# Patient Record
Sex: Male | Born: 1965 | Race: White | Hispanic: No | Marital: Married | Smoking: Current every day smoker
Health system: Southern US, Community
[De-identification: ages and names within clinical notes are randomized; demographics above are authoritative.]

## PROBLEM LIST (undated history)

## (undated) DIAGNOSIS — I10 Essential (primary) hypertension: Secondary | ICD-10-CM

## (undated) HISTORY — PX: JOINT REPLACEMENT: SHX530

---

## 1999-06-24 ENCOUNTER — Encounter: Payer: Self-pay | Admitting: Emergency Medicine

## 1999-06-24 ENCOUNTER — Emergency Department (HOSPITAL_COMMUNITY): Admission: EM | Admit: 1999-06-24 | Discharge: 1999-06-24 | Payer: Self-pay | Admitting: Emergency Medicine

## 1999-12-16 ENCOUNTER — Encounter: Payer: Self-pay | Admitting: Emergency Medicine

## 1999-12-16 ENCOUNTER — Emergency Department (HOSPITAL_COMMUNITY): Admission: EM | Admit: 1999-12-16 | Discharge: 1999-12-16 | Payer: Self-pay | Admitting: Emergency Medicine

## 2002-05-11 ENCOUNTER — Emergency Department (HOSPITAL_COMMUNITY): Admission: EM | Admit: 2002-05-11 | Discharge: 2002-05-11 | Payer: Self-pay | Admitting: Emergency Medicine

## 2002-05-11 ENCOUNTER — Encounter: Payer: Self-pay | Admitting: Emergency Medicine

## 2008-02-21 ENCOUNTER — Encounter: Admission: RE | Admit: 2008-02-21 | Discharge: 2008-02-21 | Payer: Self-pay | Admitting: Orthopedic Surgery

## 2011-03-01 ENCOUNTER — Emergency Department (HOSPITAL_BASED_OUTPATIENT_CLINIC_OR_DEPARTMENT_OTHER)
Admission: EM | Admit: 2011-03-01 | Discharge: 2011-03-02 | Disposition: A | Discharge status: Nursing Home (Includes ICF) | Payer: BC Managed Care – PPO | Source: Home / Self Care | Attending: Emergency Medicine | Admitting: Emergency Medicine

## 2011-03-01 ENCOUNTER — Emergency Department (INDEPENDENT_AMBULATORY_CARE_PROVIDER_SITE_OTHER): Payer: BC Managed Care – PPO

## 2011-03-01 DIAGNOSIS — M79609 Pain in unspecified limb: Secondary | ICD-10-CM

## 2011-03-01 DIAGNOSIS — F17200 Nicotine dependence, unspecified, uncomplicated: Secondary | ICD-10-CM | POA: Insufficient documentation

## 2011-03-01 DIAGNOSIS — R072 Precordial pain: Secondary | ICD-10-CM

## 2011-03-01 DIAGNOSIS — R079 Chest pain, unspecified: Secondary | ICD-10-CM | POA: Insufficient documentation

## 2011-03-01 LAB — CBC
Platelets: 174 10*3/uL (ref 150–400)
RBC: 4.92 MIL/uL (ref 4.22–5.81)
RDW: 12.7 % (ref 11.5–15.5)
WBC: 9.1 10*3/uL (ref 4.0–10.5)

## 2011-03-02 ENCOUNTER — Observation Stay (HOSPITAL_COMMUNITY)
Admission: EM | Admit: 2011-03-02 | Discharge: 2011-03-02 | Disposition: A | Discharge status: Home / Self Care | Payer: BC Managed Care – PPO | Source: Ambulatory Visit | Attending: Emergency Medicine | Admitting: Emergency Medicine

## 2011-03-02 DIAGNOSIS — E119 Type 2 diabetes mellitus without complications: Secondary | ICD-10-CM | POA: Insufficient documentation

## 2011-03-02 DIAGNOSIS — R209 Unspecified disturbances of skin sensation: Secondary | ICD-10-CM | POA: Insufficient documentation

## 2011-03-02 DIAGNOSIS — R072 Precordial pain: Secondary | ICD-10-CM

## 2011-03-02 DIAGNOSIS — R0602 Shortness of breath: Secondary | ICD-10-CM | POA: Insufficient documentation

## 2011-03-02 DIAGNOSIS — R51 Headache: Secondary | ICD-10-CM | POA: Insufficient documentation

## 2011-03-02 DIAGNOSIS — F17200 Nicotine dependence, unspecified, uncomplicated: Secondary | ICD-10-CM | POA: Insufficient documentation

## 2011-03-02 DIAGNOSIS — E669 Obesity, unspecified: Secondary | ICD-10-CM | POA: Insufficient documentation

## 2011-03-02 DIAGNOSIS — R079 Chest pain, unspecified: Principal | ICD-10-CM | POA: Insufficient documentation

## 2011-03-02 LAB — POCT CARDIAC MARKERS
CKMB, poc: <1 ng/mL — ABNORMAL LOW (ref 1.0–8.0)
CKMB, poc: <1 ng/mL — ABNORMAL LOW (ref 1.0–8.0)
Myoglobin, poc: 25.8 ng/mL (ref 12–200)
Myoglobin, poc: 32.3 ng/mL (ref 12–200)
Troponin i, poc: <0.05 ng/mL (ref 0.00–0.09)
Troponin i, poc: <0.05 ng/mL (ref 0.00–0.09)
Troponin i, poc: <0.05 ng/mL (ref 0.00–0.09)

## 2011-03-02 LAB — HEMOGLOBIN A1C
Hgb A1c MFr Bld: 7.1 % — ABNORMAL HIGH (ref <5.7)
Mean Plasma Glucose: 157 mg/dL — ABNORMAL HIGH (ref <117)

## 2011-03-02 LAB — COMPREHENSIVE METABOLIC PANEL
BUN: 12 mg/dL (ref 6–23)
Calcium: 9 mg/dL (ref 8.4–10.5)
Creatinine, Ser: 0.7 mg/dL (ref 0.4–1.5)
Glucose, Bld: 258 mg/dL — ABNORMAL HIGH (ref 70–99)
Total Protein: 6.5 g/dL (ref 6.0–8.3)

## 2011-03-02 LAB — D-DIMER, QUANTITATIVE: D-Dimer, Quant: <0.22 ug/mL-FEU (ref 0.00–0.48)

## 2011-09-04 ENCOUNTER — Emergency Department (INDEPENDENT_AMBULATORY_CARE_PROVIDER_SITE_OTHER): Payer: BC Managed Care – PPO

## 2011-09-04 ENCOUNTER — Emergency Department (HOSPITAL_BASED_OUTPATIENT_CLINIC_OR_DEPARTMENT_OTHER)
Admission: EM | Admit: 2011-09-04 | Discharge: 2011-09-04 | Disposition: A | Discharge status: Home / Self Care | Payer: BC Managed Care – PPO | Attending: Emergency Medicine | Admitting: Emergency Medicine

## 2011-09-04 ENCOUNTER — Encounter: Payer: Self-pay | Admitting: *Deleted

## 2011-09-04 DIAGNOSIS — M25569 Pain in unspecified knee: Secondary | ICD-10-CM | POA: Insufficient documentation

## 2011-09-04 DIAGNOSIS — X500XXA Overexertion from strenuous movement or load, initial encounter: Secondary | ICD-10-CM | POA: Insufficient documentation

## 2011-09-04 DIAGNOSIS — M25562 Pain in left knee: Secondary | ICD-10-CM

## 2011-09-04 DIAGNOSIS — Y9364 Activity, baseball: Secondary | ICD-10-CM | POA: Insufficient documentation

## 2011-09-04 DIAGNOSIS — F17200 Nicotine dependence, unspecified, uncomplicated: Secondary | ICD-10-CM | POA: Insufficient documentation

## 2011-09-04 MED ORDER — IBUPROFEN 400 MG PO TABS
600.0000 mg | ORAL_TABLET | Freq: Once | ORAL | Status: AC
  Administered 2011-09-04: 600 mg via ORAL
  Filled 2011-09-04: qty 1

## 2011-09-04 MED ORDER — IBUPROFEN 600 MG PO TABS: 600.0000 mg | ORAL_TABLET | Freq: Four times a day (QID) | ORAL | Status: AC | PRN

## 2011-09-04 MED ORDER — OXYCODONE-ACETAMINOPHEN 5-325 MG PO TABS: 1.0000 | ORAL_TABLET | ORAL | Status: AC | PRN

## 2011-09-04 NOTE — ED Provider Notes (Signed)
History    Scribed for Lyanne Co, MD, the patient was seen in room MH01/MH01. This chart was scribed by Katha Cabal. This patient's care was started at 15:20.  CSN: [id number] Arrival date & time: 09/04/2011  2:55 PM  Chief Complaint  Patient presents with  . Knee Injury     HPI  Ronald Maddox is a 45 y.o. male who presents to the Emergency Department complaining of continuous left knee pain for 3 weeks.  Injured knee while playing softball 3 weeks ago.  Patient was rounding base and planted and twisted left knee.  Reports tingling on medial aspect of left knee. Walking makes the pain worse.   Denies numbness, weakness, fever, chills, denies any other trauma.  Seen previously at [facility] for previous right knee injury.    PAST MEDICAL HISTORY:  History reviewed. No pertinent past medical history.  PAST SURGICAL HISTORY:  Past Surgical History  Procedure Date  . Joint replacement     FAMILY HISTORY:  History reviewed. No pertinent family history.   SOCIAL HISTORY: History   Social History  . Marital Status: Married    Spouse Name: N/A    Number of Children: N/A  . Years of Education: N/A   Social History Main Topics  . Smoking status: Current Everyday Smoker -- 1.0 packs/day  . Smokeless tobacco: None  . Alcohol Use: No  . Drug Use: No  . Sexually Active: No   Other Topics Concern  . None   Social History Narrative  . None      Review of Systems  All other systems reviewed and are negative.     Allergies  Review of patient's allergies indicates no known allergies.  Home Medications  No current outpatient prescriptions on file.  BP 158/95  Pulse 73  Temp(Src) 98 F (36.7 C) (Oral)  Resp 16  Ht 5\' 10"  (1.778 m)  Wt 320 lb (145.151 kg)  BMI 45.92 kg/m2  SpO2 100%  Physical Exam  Nursing note and vitals reviewed. Constitutional: He is oriented to person, place, and time. He appears well-developed and well-nourished.    HENT:  Head: Normocephalic.  Eyes: EOM are normal.  Neck: Normal range of motion.  Pulmonary/Chest: Effort normal.  Musculoskeletal: Normal range of motion.       No tenderness lateral aspect of left knee, mild tenderness of medial ascpect of joint line, No joint effusion, Distal pulse intact   Neurological: He is alert and oriented to person, place, and time.  Skin: Skin is warm and dry.       No erythema of left knee   Psychiatric: He has a normal mood and affect.    ED Course  Procedures (including critical care time)  Labs Reviewed - No data to display OTHER DATA REVIEWED: Nursing notes, vital signs, and past medical records reviewed.   DIAGNOSTIC STUDIES: Oxygen Saturation is 100% on room air, normal by my interpretation.       LABS / RADIOLOGY:   Dg Knee Complete 4 Views Left  09/04/2011  *RADIOLOGY REPORT*  Clinical Data: Persistent anterior knee pain.  Injury 3 weeks ago.  LEFT KNEE - COMPLETE 4+ VIEW  Comparison: None.  Findings: There is medial joint space loss with osteophytes. The mineralization and alignment are normal.  There is no evidence of acute fracture or dislocation.  No knee joint effusion is demonstrated.  IMPRESSION: No acute osseous findings.  Medial compartment joint space loss.  Original Report Authenticated By: Chrissie Noa  B. Purcell Mouton, M.D.      ED COURSE / COORDINATION OF CARE: 3:31 PM  Physical exam complete.  Will XR left knee.   Orders Placed This Encounter  Procedures  . DG Knee Complete 4 Views Left         MDM   MDM: Joint space on x-ray would suggest severe osteoarthritis with likely ongoing pain from this he overuse injury of left knee.  Full range of motion of left knee.  Patient will be referred to orthopedics since   IMPRESSION: Diagnoses that have been ruled out:  Diagnoses that are still under consideration:  Final diagnoses:  Pain in left knee     MEDICATIONS GIVEN IN THE E.D. Scheduled Meds:    . ibuprofen  600  mg Oral Once   Continuous Infusions:     DISCHARGE MEDICATIONS: New Prescriptions   IBUPROFEN (ADVIL,MOTRIN) 600 MG TABLET    Take 1 tablet (600 mg total) by mouth every 6 (six) hours as needed for pain.   OXYCODONE-ACETAMINOPHEN (PERCOCET) 5-325 MG PER TABLET    Take 1 tablet by mouth every 4 (four) hours as needed for pain.     I personally performed the services described in this documentation, which was scribed in my presence. The recorded information has been reviewed and considered.            Lyanne Co, MD 09/04/11 [phone]

## 2011-09-04 NOTE — ED Notes (Signed)
Pt c/o  Left knee injury x 3 weeks.

## 2016-07-14 ENCOUNTER — Encounter (HOSPITAL_COMMUNITY): Payer: Self-pay | Admitting: Emergency Medicine

## 2016-07-14 ENCOUNTER — Emergency Department (HOSPITAL_COMMUNITY)
Admission: EM | Admit: 2016-07-14 | Discharge: 2016-07-14 | Disposition: A | Discharge status: Home / Self Care | Payer: Self-pay | Attending: Emergency Medicine | Admitting: Emergency Medicine

## 2016-07-14 DIAGNOSIS — F17200 Nicotine dependence, unspecified, uncomplicated: Secondary | ICD-10-CM | POA: Insufficient documentation

## 2016-07-14 DIAGNOSIS — R197 Diarrhea, unspecified: Secondary | ICD-10-CM | POA: Insufficient documentation

## 2016-07-14 NOTE — ED Provider Notes (Signed)
[facility]-EMERGENCY DEPT Provider Note   CSN: [id number] Arrival date & time: 07/14/16  0753     History   Chief Complaint Chief Complaint  Patient presents with  . Diarrhea    HPI Ronald Maddox is a 50 y.o. male.   Diarrhea   This is a new problem. The current episode started more than 2 days ago. The problem occurs 2 to 4 times per day. The problem has been gradually improving. The stool consistency is described as watery (non bloody). Maximum temperature: had sweats x1, did not record temp. The fever has been present for less than 1 day. Associated symptoms include abdominal pain and sweats. Pertinent negatives include no vomiting, no chills, no headaches, no URI and no cough. He has tried anti-motility drugs and increased fluid intake for the symptoms. The treatment provided mild relief. Risk factors include suspect food intake and travel to endemic areas. His past medical history does not include irritable bowel syndrome, inflammatory bowel disease, short gut syndrome, bowel resection, recent abdominal surgery or malabsorption.  Abdominal Pain  Associated symptoms include diarrhea. Pertinent negatives include vomiting, dysuria, hematuria and headaches. Fever: unknown. His past medical history does not include irritable bowel syndrome.    No past medical history on file.  There are no active problems to display for this patient.   Past Surgical History:  Procedure Laterality Date  . JOINT REPLACEMENT         Home Medications    Prior to Admission medications   Medication Sig Start Date End Date Taking? Authorizing Provider  OVER THE COUNTER MEDICATION Take 1 tablet by mouth 3 (three) times daily. Raspberry Ketone     Historical Provider, MD    Family History No family history on file.  Social History Social History  Substance Use Topics  . Smoking status: Current Every Day Smoker    Packs/day: 1.00  . Smokeless tobacco: Not on file  . Alcohol use No      Allergies   Review of patient's allergies indicates no known allergies.   Review of Systems Review of Systems  Constitutional: Positive for diaphoresis. Negative for chills. Fever: unknown.  HENT: Negative.   Eyes: Negative.   Respiratory: Negative for cough.   Gastrointestinal: Positive for abdominal pain and diarrhea. Negative for vomiting.  Genitourinary: Negative for dysuria and hematuria.  Musculoskeletal: Negative.   Skin: Negative.   Neurological: Negative for syncope, light-headedness and headaches.  Psychiatric/Behavioral: Negative.      Physical Exam Updated Vital Signs BP 172/100 (BP Location: Right Arm)   Pulse 75   Temp 98.9 F (37.2 C) (Oral)   Resp 18   Wt 131.5 kg   SpO2 100%   BMI 41.61 kg/m   Physical Exam  Constitutional: He is oriented to person, place, and time. He appears well-developed and well-nourished. No distress.  Looks well, pleasant, hydrated, cooperative  HENT:  Head: Normocephalic and atraumatic.  Mouth/Throat: Oropharynx is clear and moist.  Eyes: Conjunctivae are normal. Right eye exhibits no discharge. Left eye exhibits no discharge. No scleral icterus.  Neck: Normal range of motion. Neck supple. No tracheal deviation present.  Cardiovascular: Normal rate, regular rhythm, normal heart sounds and intact distal pulses.   No murmur heard. Pulmonary/Chest: Effort normal and breath sounds normal. No stridor. No respiratory distress. He has no wheezes. He has no rales.  Abdominal: Soft. Bowel sounds are normal. He exhibits distension (mild). There is no tenderness. There is no rebound and no guarding.  No hyperactive  bowel sounds  Musculoskeletal: He exhibits no edema, tenderness or deformity.  Neurological: He is alert and oriented to person, place, and time. He is not disoriented. GCS eye subscore is 4. GCS verbal subscore is 5. GCS motor subscore is 6.  Skin: Skin is warm and dry. Capillary refill takes less than 2 seconds. He is  not diaphoretic.  Good skin turgor  Psychiatric: He has a normal mood and affect. His behavior is normal. Judgment and thought content normal.  Nursing note and vitals reviewed.    ED Treatments / Results  Labs (all labs ordered are listed, but only abnormal results are displayed) Labs Reviewed - No data to display  EKG  EKG Interpretation None       Radiology No results found.  Procedures Procedures (including critical care time)  Medications Ordered in ED Medications - No data to display   Initial Impression / Assessment and Plan / ED Course  I have reviewed the triage vital signs and the nursing notes.  Pertinent labs & imaging results that were available during my care of the patient were reviewed by me and considered in my medical decision making (see chart for details).  Clinical Course   50 year old male with no significant past me27dical history presents to the emergency department because of 5 days of watery diarrhea. Patient states he initially had dark, black stools however those temporary and resolved quickly. Stools were initially very loose, but patient reports they are becoming more formed currently. Decreased frequency of BMs as well per patient. Patient does report suspicious water intake in [place]. Abdominal pain is only present during bowel movements. Denies rectal pain. Denies any melena or hematochezia for the past few days. No nausea no vomiting.. Tolerating by mouth fluids well. Attempting solid foods as well. Abdomen is mildly distended subjectively however patient denies any pain on physical exam--unclear as to why patient rates pain as 8 on vitals assessment since he stated he had no pain currently, only with Bms. Patient appears well on exam with no evidence of a surgical abdomen at this time. No dysuria/hematuria. No significant infectious symptoms asides from one episode of sweats while in [place]. Low suspicion for ETEC/EHEC/Salmonella based on  description of BMs. Patient does not have any other medical problems, therefore doubt IBS, IBD, malabsorption syndrome secondary to pancreatic dysfunction. Patient likely is suffering from traveler's diarrhea. Discussed clinical nature of diagnosis. Patient currently appears well hydrated so will defer bloodwork. Patient states understanding and is in agreement with discharge. Encouraged establishing care with PCP, if anything for general health screenings (preventative health, Dm/HTN screening) . Usual and customary return precautions discussed for diarrhea. Patient and VS stable at d/c.  Final Clinical Impressions(s) / ED Diagnoses   Final diagnoses:  Diarrhea of presumed infectious origin    New Prescriptions Discharge Medication List as of 07/14/2016  9:03 AM        Maretta BeesLouis Gurleen Larrivee, MD 07/16/16 [id number]    Loren Raceravid Yelverton, MD 07/16/16 1627

## 2016-07-14 NOTE — ED Triage Notes (Signed)
[place] had diarrhea for 3 days ,  OTC meds not working

## 2017-06-06 ENCOUNTER — Emergency Department (HOSPITAL_COMMUNITY)
Admission: EM | Admit: 2017-06-06 | Discharge: 2017-06-06 | Disposition: A | Discharge status: Home / Self Care | Payer: Worker's Compensation | Attending: Emergency Medicine | Admitting: Emergency Medicine

## 2017-06-06 ENCOUNTER — Encounter (HOSPITAL_COMMUNITY): Payer: Self-pay | Admitting: Emergency Medicine

## 2017-06-06 ENCOUNTER — Emergency Department (HOSPITAL_COMMUNITY): Payer: Worker's Compensation

## 2017-06-06 DIAGNOSIS — L02214 Cutaneous abscess of groin: Secondary | ICD-10-CM | POA: Diagnosis not present

## 2017-06-06 DIAGNOSIS — K4090 Unilateral inguinal hernia, without obstruction or gangrene, not specified as recurrent: Secondary | ICD-10-CM

## 2017-06-06 DIAGNOSIS — F17200 Nicotine dependence, unspecified, uncomplicated: Secondary | ICD-10-CM | POA: Insufficient documentation

## 2017-06-06 DIAGNOSIS — R2242 Localized swelling, mass and lump, left lower limb: Secondary | ICD-10-CM | POA: Diagnosis present

## 2017-06-06 DIAGNOSIS — L0291 Cutaneous abscess, unspecified: Secondary | ICD-10-CM

## 2017-06-06 LAB — CBC WITH DIFFERENTIAL/PLATELET
Basophils Absolute: 0 10*3/uL (ref 0.0–0.1)
Basophils Relative: 0 %
Eosinophils Absolute: 0.2 10*3/uL (ref 0.0–0.7)
Eosinophils Relative: 1 %
HCT: 43.3 % (ref 39.0–52.0)
Hemoglobin: 14.9 g/dL (ref 13.0–17.0)
Lymphocytes Relative: 15 %
Lymphs Abs: 2.1 10*3/uL (ref 0.7–4.0)
MCH: 31 pg (ref 26.0–34.0)
MCHC: 34.4 g/dL (ref 30.0–36.0)
MCV: 90.2 fL (ref 78.0–100.0)
Monocytes Absolute: 1.2 10*3/uL — ABNORMAL HIGH (ref 0.1–1.0)
Monocytes Relative: 9 %
Neutro Abs: 10.5 10*3/uL — ABNORMAL HIGH (ref 1.7–7.7)
Neutrophils Relative %: 75 %
Platelets: 279 10*3/uL (ref 150–400)
RBC: 4.8 MIL/uL (ref 4.22–5.81)
RDW: 12.6 % (ref 11.5–15.5)
WBC: 14 10*3/uL — ABNORMAL HIGH (ref 4.0–10.5)

## 2017-06-06 LAB — BASIC METABOLIC PANEL
Anion gap: 7 (ref 5–15)
BUN: <5 mg/dL — ABNORMAL LOW (ref 6–20)
CO2: 26 mmol/L (ref 22–32)
Calcium: 8.9 mg/dL (ref 8.9–10.3)
Chloride: 97 mmol/L — ABNORMAL LOW (ref 101–111)
Creatinine, Ser: 0.62 mg/dL (ref 0.61–1.24)
GFR calc Af Amer: >60 mL/min (ref >60)
GFR calc non Af Amer: >60 mL/min (ref >60)
Glucose, Bld: 285 mg/dL — ABNORMAL HIGH (ref 65–99)
Potassium: 4.5 mmol/L (ref 3.5–5.1)
Sodium: 130 mmol/L — ABNORMAL LOW (ref 135–145)

## 2017-06-06 MED ORDER — HYDROMORPHONE HCL 1 MG/ML IJ SOLN
1.0000 mg | Freq: Once | INTRAMUSCULAR | Status: AC
  Administered 2017-06-06: 1 mg via INTRAVENOUS
  Filled 2017-06-06: qty 1

## 2017-06-06 MED ORDER — LIDOCAINE HCL 2 % IJ SOLN
20.0000 mL | Freq: Once | INTRAMUSCULAR | Status: DC
  Filled 2017-06-06: qty 20

## 2017-06-06 MED ORDER — IOPAMIDOL (ISOVUE-300) INJECTION 61%
INTRAVENOUS | Status: AC
  Administered 2017-06-06: 100 mL
  Filled 2017-06-06: qty 100

## 2017-06-06 MED ORDER — SODIUM CHLORIDE 0.9 % IV BOLUS (SEPSIS)
1000.0000 mL | Freq: Once | INTRAVENOUS | Status: AC
  Administered 2017-06-06: 1000 mL via INTRAVENOUS

## 2017-06-06 MED ORDER — OXYCODONE-ACETAMINOPHEN 5-325 MG PO TABS: 1.0000 | ORAL_TABLET | Freq: Four times a day (QID) | ORAL | 0 refills | Status: DC | PRN

## 2017-06-06 MED ORDER — MORPHINE SULFATE (PF) 4 MG/ML IV SOLN
4.0000 mg | Freq: Once | INTRAVENOUS | Status: AC
  Administered 2017-06-06: 4 mg via INTRAVENOUS
  Filled 2017-06-06: qty 1

## 2017-06-06 MED ORDER — SULFAMETHOXAZOLE-TRIMETHOPRIM 800-160 MG PO TABS: 2.0000 | ORAL_TABLET | Freq: Two times a day (BID) | ORAL | 0 refills | Status: AC

## 2017-06-06 MED ORDER — SULFAMETHOXAZOLE-TRIMETHOPRIM 800-160 MG PO TABS
1.0000 | ORAL_TABLET | Freq: Once | ORAL | Status: AC
  Administered 2017-06-06: 1 via ORAL
  Filled 2017-06-06: qty 1

## 2017-06-06 NOTE — ED Triage Notes (Signed)
Pt. Stated, I was at work and I think I rupture something in my left groin area. It hurts to touch or move .

## 2017-06-06 NOTE — ED Notes (Signed)
Patient transported to CT 

## 2017-06-06 NOTE — ED Provider Notes (Signed)
[facility]PT Provider Note   CSN: [id number] Arrival date & time: 06/06/17  1248  By signing my name below, I, Cynda Acres, attest that this documentation has been prepared under the direction and in the presence of [employer], [name], . Electronically Signed: Cynda Acres, Scribe. 06/06/17. 3:24 PM.  History   Chief Complaint Chief Complaint  Patient presents with  . Groin Pain    HPI Comments: Ronald Maddox is a 51 y.o. male with no pertinent past medical history, who presents to the Emergency Department complaining of gradual-onset, constant left groin pain that began yesterday. 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 Patient states later that night he began having a "burning" pain to the left groin. Patient believes he may have a hernia. Patient reports associated swelling to the left groin. No medications taken prior to arrival. No modifying factors indicated. Patient states his pain is worse with ambulation/sitting/coughing, nothing improves his pain. Patient states he takes aspirin daily. Patient is a chronic tobacco smoker of one pack a day. Patient denies any fever, chills, numbness, weakness, penile pain, hematuria, dysuria, or any additional symptoms.   The history is provided by the patient. No language interpreter was used.    History reviewed. No pertinent past medical history.  There are no active problems to display for this patient.   Past Surgical History:  Procedure Laterality Date  . JOINT REPLACEMENT        Home Medications    Prior to Admission medications   Medication Sig Start Date End Date Taking? Authorizing Provider  OVER THE COUNTER MEDICATION Take 1 tablet by mouth 3 (three) times daily. Raspberry Ketone     [provider]  oxyCODONE-acetaminophen (PERCOCET/ROXICET) 5-325 MG tablet Take 1 tablet by mouth every 6 (six) hours as needed for severe pain. 06/06/17   Lalia Loudon,  Tinnie Gens, [name]  sulfamethoxazole-trimethoprim (BACTRIM DS,SEPTRA DS) 800-160 MG tablet Take 2 tablets by mouth 2 (two) times daily. 06/06/17 06/13/17  [name], [name]    Family History No family history on file.  Social History Social History  Substance Use Topics  . Smoking status: Current Every Day Smoker    Packs/day: 1.00  . Smokeless tobacco: Never Used  . Alcohol use No     Allergies   Patient has no known allergies.   Review of Systems Review of Systems  Constitutional: Negative for chills and fever.  Genitourinary: Positive for testicular pain. Negative for dysuria, flank pain, hematuria and penile pain.  Neurological: Negative for weakness and numbness.  All other systems reviewed and are negative.  Physical Exam Updated Vital Signs BP (!) 167/81 (BP Location: Right Arm)   Pulse 78   Temp 99.8 F (37.7 C) (Oral)   Resp 18   Ht 5\' 10"  (1.778 m)   Wt (!) 145.2 kg (320 lb)   SpO2 96%   BMI 45.92 kg/m   Physical Exam  Constitutional: He is oriented to person, place, and time. He appears well-developed.  HENT:  Head: Normocephalic and atraumatic.  Mouth/Throat: Oropharynx is clear and moist.  Eyes: Conjunctivae and EOM are normal. Pupils are equal, round, and reactive to light.  Neck: Normal range of motion. Neck supple.  Cardiovascular: Normal rate and regular rhythm.   Pulmonary/Chest: Effort normal. No respiratory distress. He has wheezes. He has no rales.  Expiratory wheezes bilaterally.   Abdominal: Soft. Bowel sounds are normal.  Genitourinary:  Genitourinary Comments: Chaperone present throughout  entire exam. Left inguinal bulge.   Musculoskeletal: Normal range of motion.  Left inguinal bulge.   Neurological: He is alert and oriented to person, place, and time.  Skin: Skin is warm and dry.  Nursing note and vitals reviewed.    ED Treatments / Results  DIAGNOSTIC STUDIES: Oxygen Saturation is 98% on RA, normal by my interpretation.     COORDINATION OF CARE:   Labs (all labs ordered are listed, but only abnormal results are displayed) Labs Reviewed  CBC WITH DIFFERENTIAL/PLATELET - Abnormal; Notable for the following:       Result Value   WBC 14.0 (*)    Neutro Abs 10.5 (*)    Monocytes Absolute 1.2 (*)    All other components within normal limits  BASIC METABOLIC PANEL - Abnormal; Notable for the following:    Sodium 130 (*)    Chloride 97 (*)    Glucose, Bld 285 (*)    BUN <5 (*)    All other components within normal limits    EKG  EKG Interpretation None       Radiology Ct Abdomen Pelvis W Contrast  Result Date: 06/06/2017 CLINICAL DATA:  Left groin pain. EXAM: CT ABDOMEN AND PELVIS WITH CONTRAST TECHNIQUE: Multidetector CT imaging of the abdomen and pelvis was performed using the standard protocol following bolus administration of intravenous contrast. CONTRAST:  100mL ISOVUE-300 IOPAMIDOL (ISOVUE-300) INJECTION 61% COMPARISON:  None. FINDINGS: Lower chest: Lung bases are clear. No effusions. Heart is normal size. Hepatobiliary: No focal hepatic abnormality. Gallbladder unremarkable. Pancreas: No focal abnormality or ductal dilatation. Spleen: No focal abnormality.  Normal size. Adrenals/Urinary Tract: Mild bilateral perinephric stranding. No renal or ureteral stones. No hydronephrosis. Small exophytic cyst off the posterior mid pole of the right kidney. Adrenal glands and urinary bladder unremarkable. Stomach/Bowel: Appendix is normal. Stomach, large and small bowel grossly unremarkable. Vascular/Lymphatic: No evidence of aneurysm or adenopathy. Reproductive: No visible focal abnormality. Other: No free fluid or free air. There is stranding within the subcutaneous soft tissues of the left groin. Possible focal fluid collection in the subcutaneous soft tissues measuring 3.8 x 2.5 cm. This could represent developing abscess or phlegmon. Adjacent mildly prominent left inguinal lymph nodes noted. Musculoskeletal:  No acute bony abnormality. IMPRESSION: Stranding within the left inguinal subcutaneous soft tissues with possible early fluid collection suggesting early abscess or phlegmon with surrounding cellulitis. Adjacent reactive left inguinal lymph nodes. Normal appendix.  No acute intra-abdominal abnormality. Electronically Signed   By: Charlett NoseKevin  Dover M.D.   On: 06/06/2017 18:31    Procedures .Marland Kitchen.Incision and Drainage Date/Time: 06/07/2017 8:43 PM Performed by: Curlene DolphinHEDGES, Ra Pfiester Authorized by: Curlene DolphinHEDGES, Magdelene Ruark   Consent:    Consent obtained:  Verbal   Consent given by:  Patient   Risks discussed:  Bleeding, incomplete drainage, infection, damage to other organs and pain   Alternatives discussed:  No treatment, delayed treatment, alternative treatment, observation and referral Location:    Type:  Abscess   Size:  4 cm   Location: Left groin. Pre-procedure details:    Skin preparation:  Betadine Anesthesia (see MAR for exact dosages):    Anesthesia method:  Local infiltration   Local anesthetic:  Lidocaine 2% w/o epi Procedure type:    Complexity:  Simple Procedure details:    Needle aspiration: no     Incision types:  Single straight   Incision depth:  Dermal   Scalpel blade:  11   Wound management:  Probed and deloculated, irrigated with saline and extensive  cleaning   Drainage:  Purulent   Drainage amount:  Copious   Wound treatment:  Wound left open   Packing materials:  1/2 in iodoform gauze   Amount 1/2" iodoform:  10 inch    (including critical care time)  Medications Ordered in ED Medications  morphine 4 MG/ML injection 4 mg (4 mg Intravenous Given 06/06/17 1635)  HYDROmorphone (DILAUDID) injection 1 mg (1 mg Intravenous Given 06/06/17 1705)  iopamidol (ISOVUE-300) 61 % injection (100 mLs  Contrast Given 06/06/17 1808)  HYDROmorphone (DILAUDID) injection 1 mg (1 mg Intravenous Given 06/06/17 1844)  sodium chloride 0.9 % bolus 1,000 mL (0 mLs Intravenous Stopped 06/06/17 2153)   sulfamethoxazole-trimethoprim (BACTRIM DS,SEPTRA DS) 800-160 MG per tablet 1 tablet (1 tablet Oral Given 06/06/17 2236)     Initial Impression / Assessment and Plan / ED Course  I have reviewed the triage vital signs and the nursing notes.  Pertinent labs & imaging results that were available during my care of the patient were reviewed by me and considered in my medical decision making (see chart for details).     Final Clinical Impressions(s) / ED Diagnoses   Final diagnoses:  groin abscess  Abscess    Labs: CBC, BMP  Imaging: CT abdomen pelvis with contrast  Consults:  Therapeutics:  Discharge Meds:   Assessment/Plan: 51 year old male presents today with abscess to his left groin.  Patient had I&D here which was successful.  Patient tolerated procedure well.  He was started on antibiotics, he will follow-up for wound care here in the ED.  Patient given strict return precautions, he verbalized understanding and agreement to today's plan had no further questions or concerns the time discharge   New Prescriptions Discharge Medication List as of 06/06/2017  9:46 PM     I personally performed the services described in this documentation, which was scribed in my presence. The recorded information has been reviewed and is accurate.    [name], [name] 06/07/17 2113    Geoffery Lyons, MD 06/08/17 [phone]

## 2017-06-06 NOTE — Discharge Instructions (Signed)
Please read attached information. If you experience any new or worsening signs or symptoms please return to the emergency room for evaluation. Please follow-up with your primary care provider or specialist as discussed. Please use medication prescribed only as directed and discontinue taking if you have any concerning signs or symptoms.   °

## 2017-06-08 ENCOUNTER — Emergency Department (HOSPITAL_COMMUNITY)
Admission: EM | Admit: 2017-06-08 | Discharge: 2017-06-08 | Disposition: A | Discharge status: Home / Self Care | Payer: Worker's Compensation | Attending: Emergency Medicine | Admitting: Emergency Medicine

## 2017-06-08 ENCOUNTER — Encounter (HOSPITAL_COMMUNITY): Payer: Self-pay | Admitting: *Deleted

## 2017-06-08 DIAGNOSIS — F17200 Nicotine dependence, unspecified, uncomplicated: Secondary | ICD-10-CM | POA: Insufficient documentation

## 2017-06-08 DIAGNOSIS — Z5189 Encounter for other specified aftercare: Secondary | ICD-10-CM | POA: Insufficient documentation

## 2017-06-08 DIAGNOSIS — L02214 Cutaneous abscess of groin: Secondary | ICD-10-CM | POA: Diagnosis present

## 2017-06-08 NOTE — ED Triage Notes (Signed)
Pt seen [facility] for abcess drainage in the groin, pt had drainage tube placed, her for f/u & tube removal, denies fever & chills, pt reports compliance with antibiotics, A&O x4

## 2017-06-08 NOTE — Discharge Instructions (Signed)
Please read and follow all provided instructions.  Your diagnoses today include:  1. Wound check, abscess     Tests performed today include: Vital signs. See below for your results today.   Medications prescribed:  Take as prescribed   Home care instructions:  Follow any educational materials contained in this packet.  Follow-up instructions: Please follow-up with your primary care provider for further evaluation of symptoms and treatment   Return instructions:  Please return to the Emergency Department if you do not get better, if you get worse, or new symptoms OR  - Fever (temperature greater than 101.61F)  - Bleeding that does not stop with holding pressure to the area    -Severe pain (please note that you may be more sore the day after your accident)  - Chest Pain  - Difficulty breathing  - Severe nausea or vomiting  - Inability to tolerate food and liquids  - Passing out  - Skin becoming red around your wounds  - Change in mental status (confusion or lethargy)  - New numbness or weakness    Please return if you have any other emergent concerns.  Additional Information:  Your vital signs today were: BP (!) 191/92 (BP Location: Right Arm)    Pulse 79    Temp 98.4 F (36.9 C) (Oral)    Resp 20    Ht 5\' 10"  (1.778 m)    Wt (!) 145.2 kg (320 lb)    SpO2 97%    BMI 45.92 kg/m  If your blood pressure (BP) was elevated above 135/85 this visit, please have this repeated by your doctor within one month. ---------------

## 2017-06-08 NOTE — ED Provider Notes (Signed)
[facility] Provider Note   CSN: [id number] Arrival date & time: 06/08/17  1146   By signing my name below, I, Ronald Maddox, attest that this documentation has been prepared under the direction and in the presence of [name], [name]. Electronically Signed: Freida Busmaniana Maddox, Scribe. 06/08/2017. 12:36 PM.   History   Chief Complaint Chief Complaint  Patient presents with  . Abscess    pt for f/u drain removal      The history is provided by the patient. No language interpreter was used.     HPI Comments:  Ronald Maddox is a 51 y.o. male who presents to the Emergency Department for drain removal. Pt was seen in the ED on 06/06/2017 for left groin pain. He has an I& D at the site and a drain tube was placed. He was discharged on Bactrim and advised to return today to have the drain removed. Pt notes the site appears much better but notes continued tenderness to the area. Pt has no other acute complaints or associated symptoms at this time.   History reviewed. No pertinent past medical history.  There are no active problems to display for this patient.   Past Surgical History:  Procedure Laterality Date  . JOINT REPLACEMENT         Home Medications    Prior to Admission medications   Medication Sig Start Date End Date Taking? Authorizing Provider  OVER THE COUNTER MEDICATION Take 1 tablet by mouth 3 (three) times daily. Raspberry Ketone     [provider]  oxyCODONE-acetaminophen (PERCOCET/ROXICET) 5-325 MG tablet Take 1 tablet by mouth every 6 (six) hours as needed for severe pain. 06/06/17   [name], [name], [name]  sulfamethoxazole-trimethoprim (BACTRIM DS,SEPTRA DS) 800-160 MG tablet Take 2 tablets by mouth 2 (two) times daily. 06/06/17 06/13/17  Eyvonne MechanicHedges, [name], [name]    Family History No family history on file.  Social History Social History  Substance Use Topics  . Smoking status: Current Every Day Smoker    Packs/day: 1.00  . Smokeless tobacco:  Never Used  . Alcohol use No     Allergies   Patient has no known allergies.   Review of Systems Review of Systems  Constitutional: Negative for chills and fever.  Respiratory: Negative for shortness of breath.   Cardiovascular: Negative for chest pain.  Skin: Positive for wound.     Physical Exam Updated Vital Signs BP (!) 191/92 (BP Location: Right Arm)   Pulse 79   Temp 98.4 F (36.9 C) (Oral)   Resp 20   Ht 5\' 10"  (1.778 m)   Wt (!) 320 lb (145.2 kg)   SpO2 97%   BMI 45.92 kg/m   Physical Exam  Constitutional: He is oriented to person, place, and time. Vital signs are normal. He appears well-developed and well-nourished. No distress.  HENT:  Head: Normocephalic and atraumatic.  Right Ear: Hearing normal.  Left Ear: Hearing normal.  Eyes: Pupils are equal, round, and reactive to light. Conjunctivae and EOM are normal.  Cardiovascular: Normal rate and regular rhythm.   Pulmonary/Chest: Effort normal.  Abdominal: He exhibits no distension.  Neurological: He is alert and oriented to person, place, and time.  Skin: Skin is warm and dry.  Left inguinal  I&D site without erythema; no palpable fluctuance No obvious swelling  Appears well healing  Psychiatric: He has a normal mood and affect. His speech is normal and behavior is normal. Thought content normal.  Nursing note and vitals reviewed.  ED Treatments / Results  DIAGNOSTIC STUDIES:  Oxygen Saturation is 97% on RA, normal by my interpretation.    COORDINATION OF CARE:  12:36 PM Discussed treatment plan with pt at bedside and pt agreed to plan.  Labs (all labs ordered are listed, but only abnormal results are displayed) Labs Reviewed - No data to display  EKG  EKG Interpretation None       Radiology Ct Abdomen Pelvis W Contrast  Result Date: 06/06/2017 CLINICAL DATA:  Left groin pain. EXAM: CT ABDOMEN AND PELVIS WITH CONTRAST TECHNIQUE: Multidetector CT imaging of the abdomen and pelvis was  performed using the standard protocol following bolus administration of intravenous contrast. CONTRAST:  ISOVUE-300 IOPAMIDOL (ISOVUE-300) INJECTION 61% COMPARISON:  None. FINDINGS: Lower chest: Lung bases are clear. No effusions. Heart is normal size. Hepatobiliary: No focal hepatic abnormality. Gallbladder unremarkable. Pancreas: No focal abnormality or ductal dilatation. Spleen: No focal abnormality.  Normal size. Adrenals/Urinary Tract: Mild bilateral perinephric stranding. No renal or ureteral stones. No hydronephrosis. Small exophytic cyst off the posterior mid pole of the right kidney. Adrenal glands and urinary bladder unremarkable. Stomach/Bowel: Appendix is normal. Stomach, large and small bowel grossly unremarkable. Vascular/Lymphatic: No evidence of aneurysm or adenopathy. Reproductive: No visible focal abnormality. Other: No free fluid or free air. There is stranding within the subcutaneous soft tissues of the left groin. Possible focal fluid collection in the subcutaneous soft tissues measuring 3.8 x 2.5 cm. This could represent developing abscess or phlegmon. Adjacent mildly prominent left inguinal lymph nodes noted. Musculoskeletal: No acute bony abnormality. IMPRESSION: Stranding within the left inguinal subcutaneous soft tissues with possible early fluid collection suggesting early abscess or phlegmon with surrounding cellulitis. Adjacent reactive left inguinal lymph nodes. Normal appendix.  No acute intra-abdominal abnormality. Electronically Signed   By: Charlett Nose M.D.   On: 06/06/2017 18:31    Procedures Procedures (including critical care time)  Medications Ordered in ED Medications - No data to display   Initial Impression / Assessment and Plan / ED Course  I have reviewed the triage vital signs and the nursing notes.  Pertinent labs & imaging results that were available during my care of the patient were reviewed by me and considered in my medical decision making (see  chart for details).     {I have reviewed the relevant previous healthcare records.  {I obtained HPI from historian.   ED Course:  Assessment: Patient returns for check of recent incision and drainage site and drain tube removal. The region appears to be well-healing and infection appears to be resolving. Patient symptoms improved from prior visit. Afebrile and hemodynamically stable. Pt is instructed to continue with home care or antibiotics. Pt has a good understanding of return precautions and is safe for discharge at this time.   Disposition/Plan:  DC Home Additional Verbal discharge instructions given and discussed with patient.  Pt Instructed to f/u with PCP in the next week for evaluation and treatment of symptoms. Return precautions given Pt acknowledges and agrees with plan  Supervising Physician Arby Barrette, MD   Final Clinical Impressions(s) / ED Diagnoses   Final diagnoses:  Wound check, abscess    New Prescriptions New Prescriptions   No medications on file   I personally performed the services described in this documentation, which was scribed in my presence. The recorded information has been reviewed and is accurate.    Audry Pili, [name] 06/08/17 1324    Arby Barrette, MD 06/14/17 [phone]

## 2017-12-12 IMAGING — CT CT ABD-PELV W/ CM
2 of 5 series · 17 of 46 positions shown, 19 images · IV contrast (Omni 300)
Comparison: None.

CLINICAL DATA: Left groin pain.

EXAM:
CT ABDOMEN AND PELVIS WITH CONTRAST
TECHNIQUE: Multidetector CT imaging of the abdomen and pelvis was performed
using the standard protocol following bolus administration of
intravenous contrast.
CONTRAST:  100mL YD3YJH-MGG IOPAMIDOL (YD3YJH-MGG) INJECTION 61%

[Series 3: a/p w/ 5mm · axial · 0.98mm/px · z∈[+699,+1129]mm · 14 of 98 slices shown, 16 images]
[im 6/98  soft-tissue]
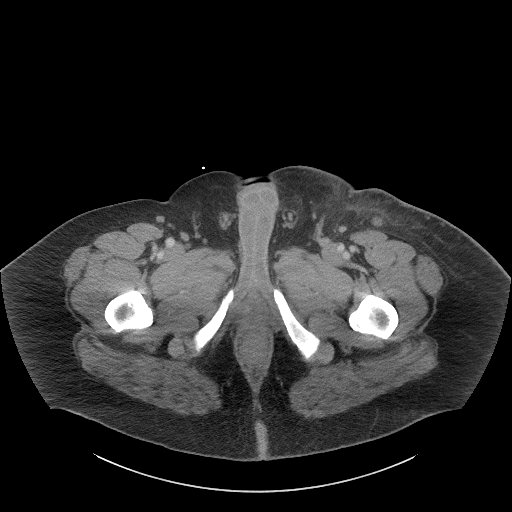
[im 6/98  bone]
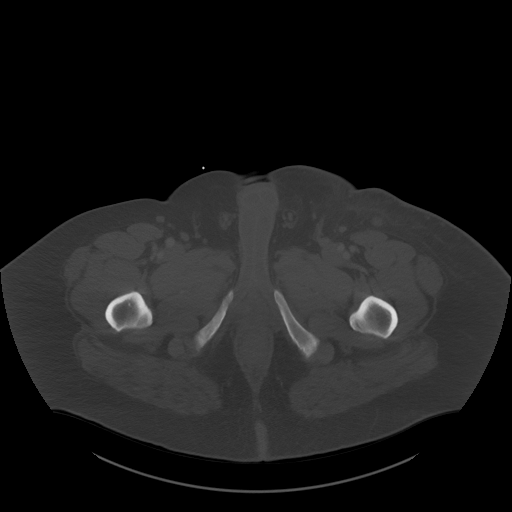
[im 11/98  soft-tissue]
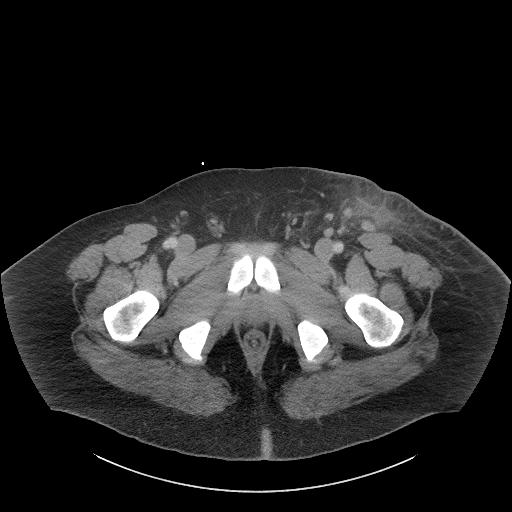
[im 21/98  soft-tissue]
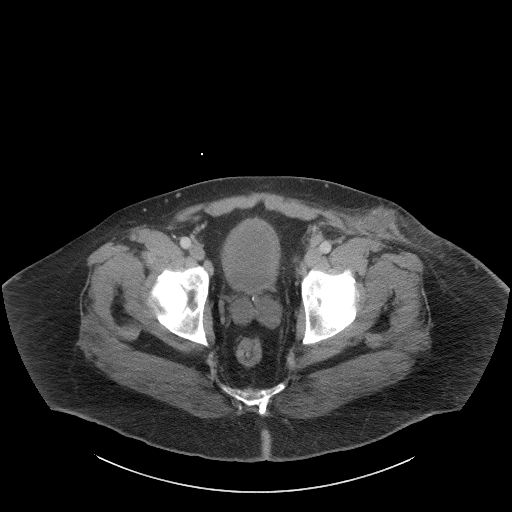
[im 26/98  soft-tissue]
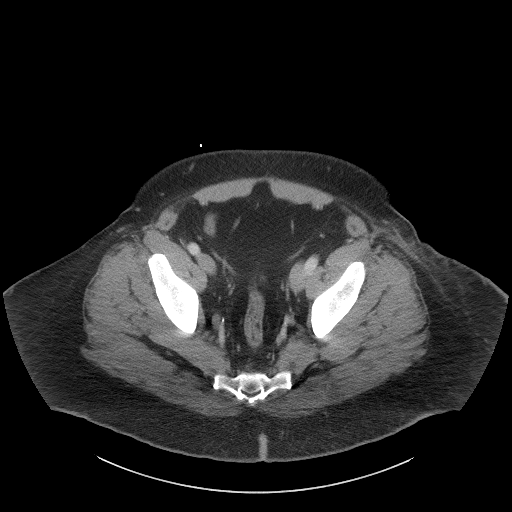
[im 31/98  soft-tissue]
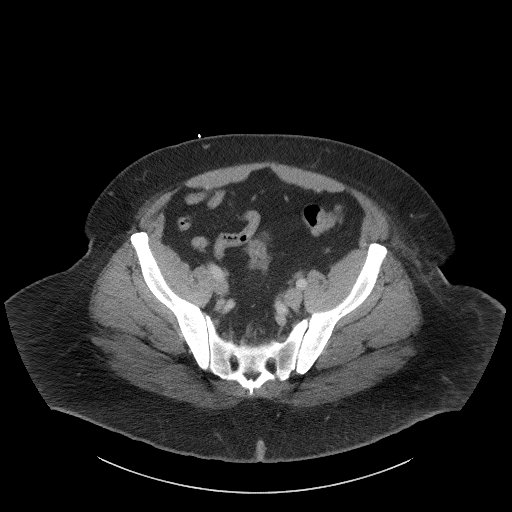
[im 41/98  soft-tissue]
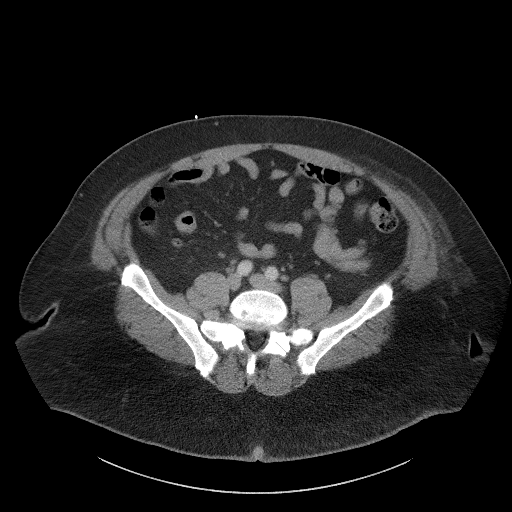
[im 46/98  soft-tissue]
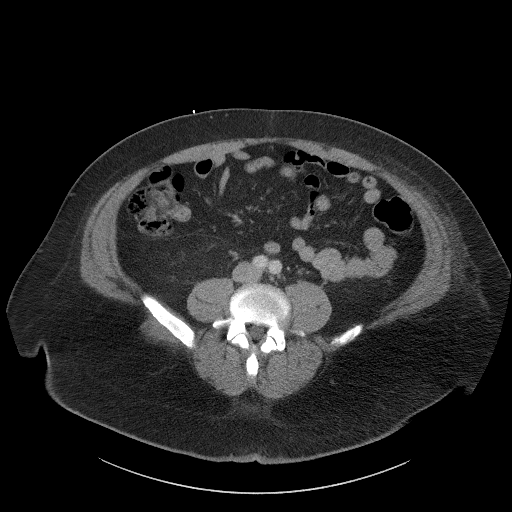
[im 52/98  soft-tissue]
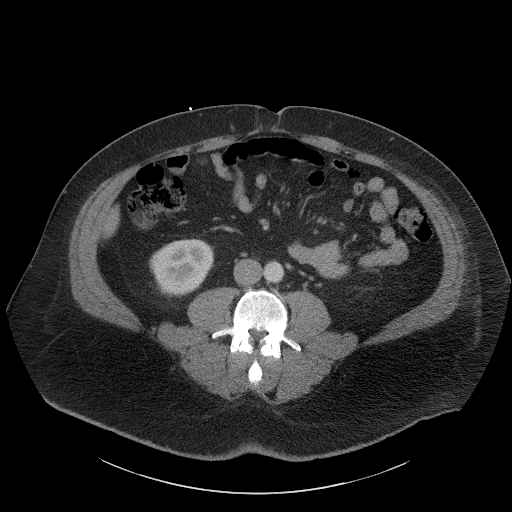
[im 57/98  soft-tissue]
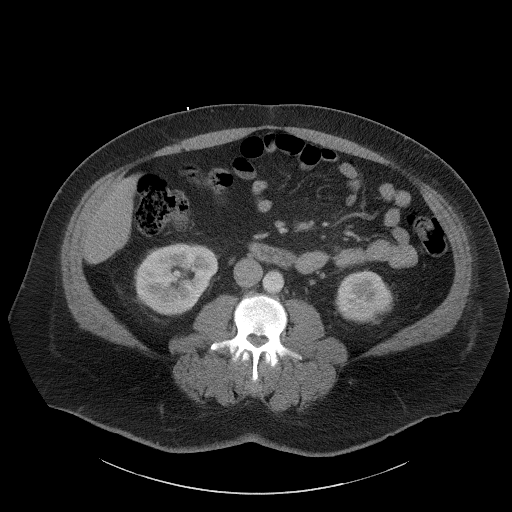
[im 57/98  bone]
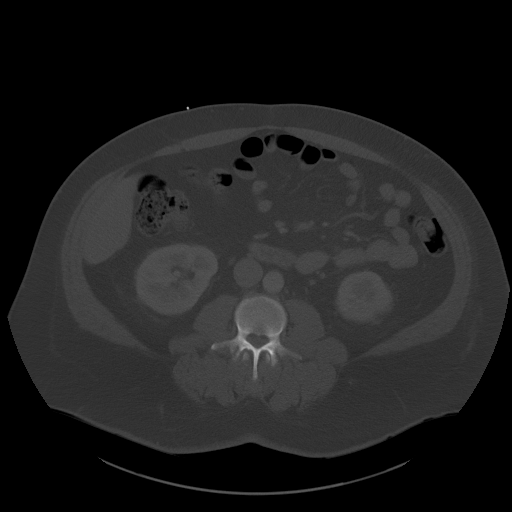
[im 67/98  soft-tissue]
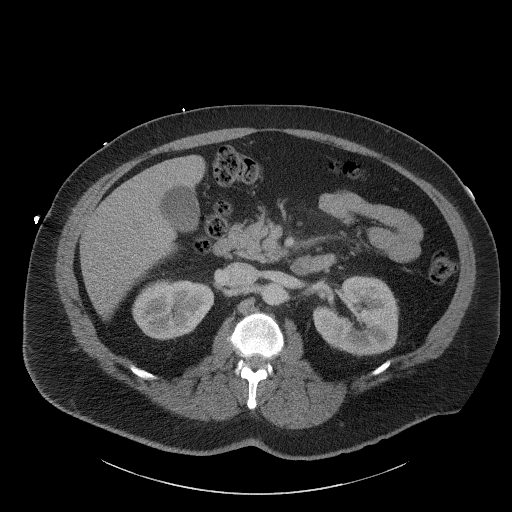
[im 72/98  soft-tissue]
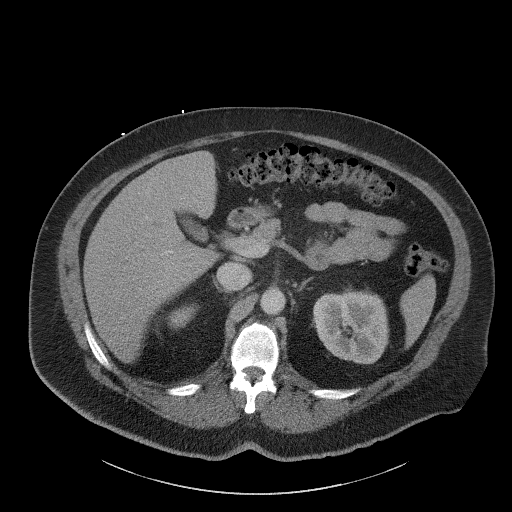
[im 77/98  soft-tissue]
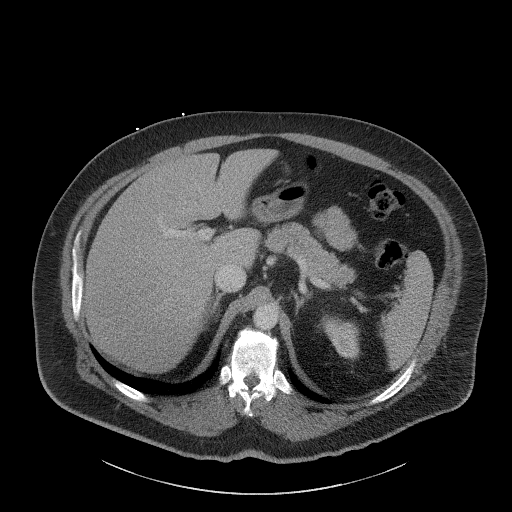
[im 87/98  soft-tissue]
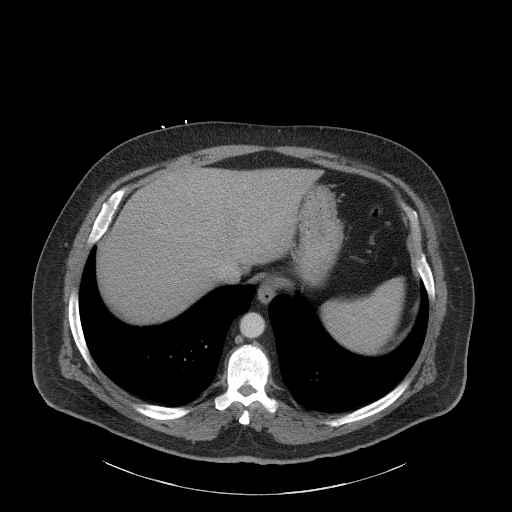
[im 92/98  soft-tissue]
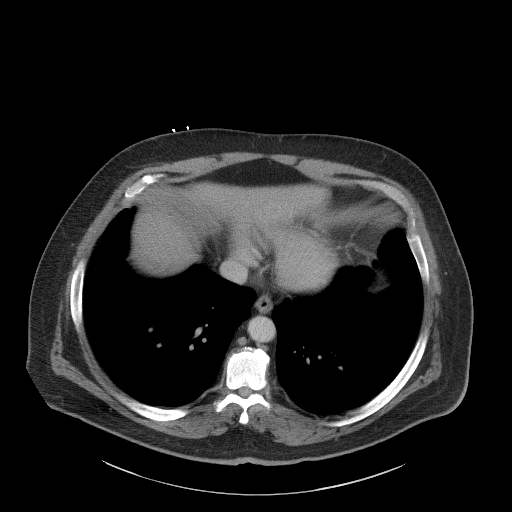

[Series 6: a/p w/ cor · coronal · 0.95mm/px · 3 of 182 slices shown]
[im 61/182  soft-tissue]
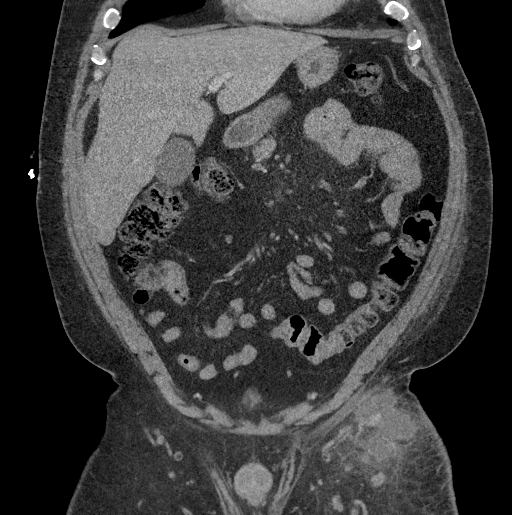
[im 81/182  soft-tissue]
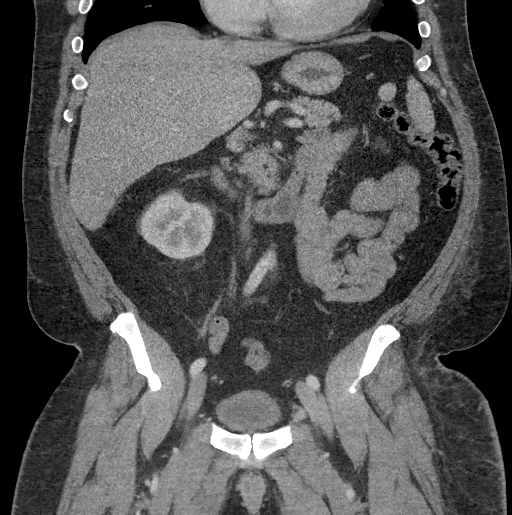
[im 101/182  soft-tissue]
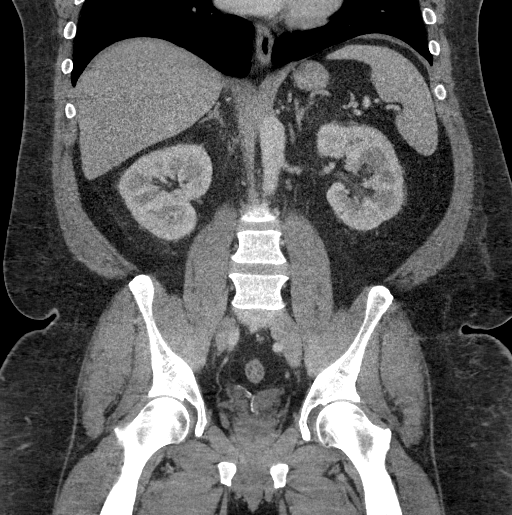

[17 of 46 positions shown; findings below may reference images not displayed]

FINDINGS: Lower chest: Lung bases are clear. No effusions. Heart is normal
size.

Hepatobiliary: No focal hepatic abnormality. Gallbladder
unremarkable.

Pancreas: No focal abnormality or ductal dilatation.

Spleen: No focal abnormality.  Normal size.

Adrenals/Urinary Tract: Mild bilateral perinephric stranding. No
renal or ureteral stones. No hydronephrosis. Small exophytic cyst
off the posterior mid pole of the right kidney. Adrenal glands and
urinary bladder unremarkable.

Stomach/Bowel: Appendix is normal. Stomach, large and small bowel
grossly unremarkable.

Vascular/Lymphatic: No evidence of aneurysm or adenopathy.

Reproductive: No visible focal abnormality.

Other: No free fluid or free air. There is stranding within the
subcutaneous soft tissues of the left groin. Possible focal fluid
collection in the subcutaneous soft tissues measuring 3.8 x 2.5 cm.
This could represent developing abscess or phlegmon. Adjacent mildly
prominent left inguinal lymph nodes noted.

Musculoskeletal: No acute bony abnormality.
IMPRESSION: Stranding within the left inguinal subcutaneous soft tissues with
possible early fluid collection suggesting early abscess or phlegmon
with surrounding cellulitis. Adjacent reactive left inguinal lymph
nodes.

Normal appendix.  No acute intra-abdominal abnormality.

## 2018-08-12 ENCOUNTER — Encounter (HOSPITAL_COMMUNITY): Payer: Self-pay | Admitting: Emergency Medicine

## 2018-08-12 ENCOUNTER — Ambulatory Visit (HOSPITAL_COMMUNITY)
Admission: EM | Admit: 2018-08-12 | Discharge: 2018-08-12 | Disposition: A | Discharge status: Home / Self Care | Payer: Self-pay | Attending: Family Medicine | Admitting: Family Medicine

## 2018-08-12 DIAGNOSIS — L02219 Cutaneous abscess of trunk, unspecified: Secondary | ICD-10-CM

## 2018-08-12 DIAGNOSIS — L03319 Cellulitis of trunk, unspecified: Principal | ICD-10-CM

## 2018-08-12 HISTORY — DX: Essential (primary) hypertension: I10

## 2018-08-12 MED ORDER — LIDOCAINE-EPINEPHRINE (PF) 2 %-1:200000 IJ SOLN
INTRAMUSCULAR | Status: AC
  Filled 2018-08-12: qty 20

## 2018-08-12 MED ORDER — DOXYCYCLINE HYCLATE 100 MG PO CAPS: 100.0000 mg | ORAL_CAPSULE | Freq: Two times a day (BID) | ORAL | 0 refills | Status: AC

## 2018-08-12 NOTE — Discharge Instructions (Signed)
Please begin doxycycline for 10 days  Apply warm compresses/hot rags to area with massage to express further drainage especially the first 24-48 hours  Return if symptoms returning or not improving, developing worsening pain, swelling, redness spreading, fever

## 2018-08-12 NOTE — ED Triage Notes (Signed)
Pt states about 3 weeks ago he noticed a rash or boil on his chest, then 5 days ago he noticed it on his left shoulder.

## 2018-08-13 DIAGNOSIS — L02219 Cutaneous abscess of trunk, unspecified: Secondary | ICD-10-CM

## 2018-08-13 DIAGNOSIS — L03319 Cellulitis of trunk, unspecified: Secondary | ICD-10-CM

## 2018-08-13 NOTE — ED Provider Notes (Signed)
[facility]    CSN: [id number] Arrival date & time: 08/12/18  1023     History   Chief Complaint Chief Complaint  Patient presents with  . Recurrent Skin Infections    HPI Ronald Maddox is a 52 y.o. male history of hypertension presenting today for evaluation of skin infection.  Patient states that 3 weeks ago he began to have boils on his chest, these have relatively resolved, but he over the past 5 days he is noticed a large area on his left back that has become quite painful and increased in size.  He feels this putting a pressure sensation on his back and through to his chest.  He has not applied anything to this area.  He denies previous issues with abscesses.  Denies fevers.  Denies shortness of breath.  HPI  Past Medical History:  Diagnosis Date  . Hypertension     There are no active problems to display for this patient.   Past Surgical History:  Procedure Laterality Date  . JOINT REPLACEMENT         Home Medications    Prior to Admission medications   Medication Sig Start Date End Date Taking? Authorizing Provider  doxycycline (VIBRAMYCIN) 100 MG capsule Take 1 capsule (100 mg total) by mouth 2 (two) times daily for 10 days. 08/12/18 08/22/18  Girlie Veltri C, [name]  OVER THE COUNTER MEDICATION Take 1 tablet by mouth 3 (three) times daily. Raspberry Ketone     [provider]    Family History No family history on file.  Social History Social History   Tobacco Use  . Smoking status: Current Every Day Smoker    Packs/day: 1.00  . Smokeless tobacco: Never Used  Substance Use Topics  . Alcohol use: No  . Drug use: No     Allergies   Patient has no known allergies.   Review of Systems Review of Systems  Constitutional: Negative for fatigue and fever.  Eyes: Negative for redness, itching and visual disturbance.  Respiratory: Negative for shortness of breath.   Cardiovascular: Negative for chest pain and leg swelling.    Gastrointestinal: Negative for nausea and vomiting.  Musculoskeletal: Negative for arthralgias and myalgias.  Skin: Positive for color change and wound. Negative for rash.  Neurological: Negative for dizziness, syncope, weakness, light-headedness and headaches.     Physical Exam Triage Vital Signs ED Triage Vitals  Enc Vitals Group     BP 08/12/18 1137 (!) 190/88     Pulse Rate 08/12/18 1135 97     Resp 08/12/18 1135 16     Temp 08/12/18 1135 99 F (37.2 C)     Temp src --      SpO2 08/12/18 1135 98 %     Weight --      Height --      Head Circumference --      Peak Flow --      Pain Score --      Pain Loc --      Pain Edu? --      Excl. in GC? --    No data found.  Updated Vital Signs BP (!) 190/88   Pulse 97   Temp 99 F (37.2 C)   Resp 16   SpO2 98%   Visual Acuity Right Eye Distance:   Left Eye Distance:   Bilateral Distance:    Right Eye Near:   Left Eye Near:    Bilateral Near:  Physical Exam  Constitutional: He is oriented to person, place, and time. He appears well-developed and well-nourished.  No acute distress  HENT:  Head: Normocephalic and atraumatic.  Nose: Nose normal.  Eyes: Conjunctivae are normal.  Neck: Neck supple.  Cardiovascular: Normal rate.  Pulmonary/Chest: Effort normal. No respiratory distress.  Abdominal: He exhibits no distension.  Musculoskeletal: Normal range of motion.  Neurological: He is alert and oriented to person, place, and time.  Skin: Skin is warm and dry.  Multiple 1 cm hyperpigmented areas to left chest/pectoral area, appears to be healing abscesses, 1 cm erythematous abscess below the left pec area, central opening with thickened drainage  Left back with large erythematous and indurated area approximately 5 cm x 4 cm with central peeling of skin beginning to ulcerated open small areas where pus appears to be trying to break through  Psychiatric: He has a normal mood and affect.  Nursing note and vitals  reviewed.    UC Treatments / Results  Labs (all labs ordered are listed, but only abnormal results are displayed) Labs Reviewed - No data to display  EKG None  Radiology No results found.  Procedures Incision and Drainage Date/Time: 08/13/2018 8:21 AM Performed by: Cristopher Ciccarelli, Junius Creamer, [name] Authorized by: Mardella Layman, MD   Consent:    Consent obtained:  Verbal   Consent given by:  Patient   Risks discussed:  Bleeding, damage to other organs, incomplete drainage and pain   Alternatives discussed:  No treatment Location:    Type:  Abscess   Size:  5 cm   Location:  Trunk   Trunk location:  Back Pre-procedure details:    Skin preparation:  Betadine Anesthesia (see MAR for exact dosages):    Anesthesia method:  Local infiltration   Local anesthetic:  Lidocaine 2% w/o epi Procedure type:    Complexity:  Simple Procedure details:    Needle aspiration: no     Incision types:  Single straight   Incision depth:  Subcutaneous   Scalpel blade:  11   Wound management:  Probed and deloculated   Drainage:  Bloody and purulent   Drainage amount:  Moderate   Wound treatment:  Wound left open   Packing materials:  None Post-procedure details:    Patient tolerance of procedure:  Tolerated well, no immediate complications   (including critical care time)  Medications Ordered in UC Medications - No data to display  Initial Impression / Assessment and Plan / UC Course  I have reviewed the triage vital signs and the nursing notes.  Pertinent labs & imaging results that were available during my care of the patient were reviewed by me and considered in my medical decision making (see chart for details).     Patient with multiple abscesses in various stages of healing, I&D performed on large abscess on back, will begin on doxycycline, have initiate warm compresses with massage to express further drainage and soften indurated tissue.Discussed strict return precautions. Patient  verbalized understanding and is agreeable with plan.  Final Clinical Impressions(s) / UC Diagnoses   Final diagnoses:  Cellulitis and abscess of trunk     Discharge Instructions     Please begin doxycycline for 10 days  Apply warm compresses/hot rags to area with massage to express further drainage especially the first 24-48 hours  Return if symptoms returning or not improving, developing worsening pain, swelling, redness spreading, fever   ED Prescriptions    Medication Sig Dispense Auth. Provider   doxycycline (VIBRAMYCIN)  100 MG capsule Take 1 capsule (100 mg total) by mouth 2 (two) times daily for 10 days. 20 capsule Ameya Vowell C, [name]     Controlled Substance Prescriptions [place] Controlled Substance Registry consulted? Not Applicable   Lew Dawes, [place] 08/13/18 [phone]
